# Patient Record
Sex: Male | Born: 1965 | Race: White | Hispanic: No | Smoking: Never smoker
Health system: Southern US, Community
[De-identification: ages and names within clinical notes are randomized; demographics above are authoritative.]

## PROBLEM LIST (undated history)

## (undated) ENCOUNTER — Emergency Department (HOSPITAL_COMMUNITY): Discharge status: Absent without leave | Payer: Self-pay

---

## 2004-06-30 ENCOUNTER — Emergency Department (HOSPITAL_COMMUNITY): Admission: EM | Admit: 2004-06-30 | Discharge: 2004-06-30 | Payer: Self-pay | Admitting: Emergency Medicine

## 2006-02-26 ENCOUNTER — Ambulatory Visit: Payer: Self-pay | Admitting: Internal Medicine

## 2011-08-09 ENCOUNTER — Encounter (HOSPITAL_BASED_OUTPATIENT_CLINIC_OR_DEPARTMENT_OTHER): Payer: Self-pay | Admitting: *Deleted

## 2011-08-09 ENCOUNTER — Emergency Department (HOSPITAL_BASED_OUTPATIENT_CLINIC_OR_DEPARTMENT_OTHER)
Admission: EM | Admit: 2011-08-09 | Discharge: 2011-08-09 | Disposition: A | Discharge status: Home / Self Care | Payer: Self-pay | Attending: Emergency Medicine | Admitting: Emergency Medicine

## 2011-08-09 DIAGNOSIS — L02416 Cutaneous abscess of left lower limb: Secondary | ICD-10-CM

## 2011-08-09 DIAGNOSIS — L02419 Cutaneous abscess of limb, unspecified: Secondary | ICD-10-CM | POA: Insufficient documentation

## 2011-08-09 MED ORDER — SODIUM CHLORIDE 0.9 % IV BOLUS (SEPSIS): 1000.0000 mL | Freq: Once | INTRAVENOUS | Status: DC

## 2011-08-09 MED ORDER — INSULIN REGULAR HUMAN 100 UNIT/ML IJ SOLN: 10.0000 [IU] | Freq: Once | INTRAMUSCULAR | Status: DC

## 2011-08-09 MED ORDER — CEPHALEXIN 500 MG PO CAPS: 500.0000 mg | ORAL_CAPSULE | Freq: Three times a day (TID) | ORAL | Status: AC

## 2011-08-09 MED ORDER — POTASSIUM CHLORIDE CRYS ER 20 MEQ PO TBCR: 40.0000 meq | EXTENDED_RELEASE_TABLET | Freq: Once | ORAL | Status: DC

## 2011-08-09 MED ORDER — SULFAMETHOXAZOLE-TRIMETHOPRIM 800-160 MG PO TABS: 1.0000 | ORAL_TABLET | Freq: Two times a day (BID) | ORAL | Status: AC

## 2011-08-09 MED ORDER — HYDROCODONE-ACETAMINOPHEN 5-500 MG PO TABS: 1.0000 | ORAL_TABLET | Freq: Four times a day (QID) | ORAL | Status: AC | PRN

## 2011-08-09 NOTE — ED Notes (Signed)
Pt c/o left thigh abscess x 5 days. Redness noted

## 2011-08-09 NOTE — ED Provider Notes (Signed)
History     CSN: [id number]  Arrival date & time 08/09/11  2150   First MD Initiated Contact with Patient 08/09/11 2257      Chief Complaint  Patient presents with  . Abscess    (Consider location/radiation/quality/duration/timing/severity/associated sxs/prior treatment) Patient is a 46 y.o. male presenting with abscess. The history is provided by the patient.  Abscess  This is a new problem. Episode onset: 5 days ago. The onset was gradual. The problem occurs continuously. The problem has been gradually worsening. Affected Location: left thigh. The problem is moderate. The abscess is characterized by painfulness and redness. Associated with: nothing. The abscess first occurred at home.    History reviewed. No pertinent past medical history.  History reviewed. No pertinent past surgical history.  History reviewed. No pertinent family history.  History  Substance Use Topics  . Smoking status: Never Smoker   . Smokeless tobacco: Not on file  . Alcohol Use: No      Review of Systems  All other systems reviewed and are negative.    Allergies  Review of patient's allergies indicates no known allergies.  Home Medications   Current Outpatient Rx  Name Route Sig Dispense Refill  . DOXYCYCLINE HYCLATE 100 MG PO TBEC Oral Take 100 mg by mouth 2 (two) times daily.    . TRIPLE ANTIBIOTIC [phone] EX OINT Topical Apply topically 4 (four) times daily. Patient used this medication for his abscess.      BP 100/74  Pulse 90  Temp 98.8 F (37.1 C) (Oral)  Resp 16  Ht 6' (1.829 m)  Wt 240 lb (108.863 kg)  BMI 32.55 kg/m2  SpO2 100%  Physical Exam  Nursing note and vitals reviewed. Constitutional: He is oriented to person, place, and time. He appears well-developed and well-nourished. No distress.  HENT:  Head: Normocephalic and atraumatic.  Neck: Normal range of motion. Neck supple.  Musculoskeletal:       There is an small, firm hair follicle with surrounding  erythema and warmth on the posterior aspect of the left thigh.  There is no definite fluctuance.  Neurological: He is alert and oriented to person, place, and time.  Skin: Skin is warm and dry. He is not diaphoretic.    ED Course  Procedures (including critical care time)  Labs Reviewed - No data to display No results found.   No diagnosis found.  INCISION AND DRAINAGE Performed by: Geoffery Lyons Consent: Verbal consent obtained. Risks and benefits: risks, benefits and alternatives were discussed Type: abscess  Body area: left thigh  Anesthesia: local infiltration  Local anesthetic: lidocaine 1% without epinephrine  Anesthetic total: 2 ml  Complexity: complex Blunt dissection to break up loculations  Drainage: purulent  Drainage amount: small  Packing material: no packing  Patient tolerance: Patient tolerated the procedure well with no immediate complications.     MDM  Warm soaks, keflex, and bactrim.  Return prn if worsening.        Geoffery Lyons, MD 08/09/11 [phone]

## 2013-10-28 ENCOUNTER — Emergency Department (HOSPITAL_BASED_OUTPATIENT_CLINIC_OR_DEPARTMENT_OTHER): Payer: No Typology Code available for payment source

## 2013-10-28 ENCOUNTER — Encounter (HOSPITAL_BASED_OUTPATIENT_CLINIC_OR_DEPARTMENT_OTHER): Payer: Self-pay | Admitting: Emergency Medicine

## 2013-10-28 ENCOUNTER — Emergency Department (HOSPITAL_BASED_OUTPATIENT_CLINIC_OR_DEPARTMENT_OTHER)
Admission: EM | Admit: 2013-10-28 | Discharge: 2013-10-28 | Disposition: A | Discharge status: Home / Self Care | Payer: No Typology Code available for payment source | Attending: Emergency Medicine | Admitting: Emergency Medicine

## 2013-10-28 DIAGNOSIS — Y9389 Activity, other specified: Secondary | ICD-10-CM | POA: Diagnosis not present

## 2013-10-28 DIAGNOSIS — S8000XA Contusion of unspecified knee, initial encounter: Secondary | ICD-10-CM | POA: Diagnosis not present

## 2013-10-28 DIAGNOSIS — S60229A Contusion of unspecified hand, initial encounter: Secondary | ICD-10-CM | POA: Insufficient documentation

## 2013-10-28 DIAGNOSIS — S6990XA Unspecified injury of unspecified wrist, hand and finger(s), initial encounter: Secondary | ICD-10-CM | POA: Diagnosis present

## 2013-10-28 DIAGNOSIS — Y92410 Unspecified street and highway as the place of occurrence of the external cause: Secondary | ICD-10-CM | POA: Diagnosis not present

## 2013-10-28 DIAGNOSIS — S60222A Contusion of left hand, initial encounter: Secondary | ICD-10-CM

## 2013-10-28 DIAGNOSIS — Z79899 Other long term (current) drug therapy: Secondary | ICD-10-CM | POA: Insufficient documentation

## 2013-10-28 DIAGNOSIS — S8002XA Contusion of left knee, initial encounter: Secondary | ICD-10-CM

## 2013-10-28 DIAGNOSIS — Z792 Long term (current) use of antibiotics: Secondary | ICD-10-CM | POA: Diagnosis not present

## 2013-10-28 MED ORDER — NAPROXEN 375 MG PO TABS
375.0000 mg | ORAL_TABLET | Freq: Two times a day (BID) | ORAL | Status: DC
Start: 2013-10-28 — End: 2019-08-02

## 2013-10-28 NOTE — Discharge Instructions (Signed)
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
## 2013-10-28 NOTE — ED Notes (Signed)
MD at bedside. 

## 2013-11-10 ENCOUNTER — Encounter: Payer: Self-pay | Admitting: Family Medicine

## 2013-11-10 ENCOUNTER — Ambulatory Visit (INDEPENDENT_AMBULATORY_CARE_PROVIDER_SITE_OTHER): Payer: Self-pay | Admitting: Family Medicine

## 2013-11-10 VITALS — BP 135/82 | HR 49 | Ht 75.0 in | Wt 240.0 lb

## 2013-11-10 DIAGNOSIS — S6980XA Other specified injuries of unspecified wrist, hand and finger(s), initial encounter: Secondary | ICD-10-CM

## 2013-11-10 DIAGNOSIS — S6992XA Unspecified injury of left wrist, hand and finger(s), initial encounter: Secondary | ICD-10-CM

## 2013-11-10 DIAGNOSIS — S6990XA Unspecified injury of unspecified wrist, hand and finger(s), initial encounter: Secondary | ICD-10-CM

## 2013-11-10 NOTE — Patient Instructions (Signed)
Start occupational therapy - do home exercises on days you don't go to therapy. Icing 15 minutes at a time 3-4 times a day. Ibuprofen  three times a day with food OR aleve 2 tabs twice a day with food for pain and inflammation. Follow up with me in 4 weeks for reevaluation. Will consider MRI if not improving as expected.

## 2013-11-11 ENCOUNTER — Encounter: Payer: Self-pay | Admitting: Family Medicine

## 2013-11-11 DIAGNOSIS — S6992XA Unspecified injury of left wrist, hand and finger(s), initial encounter: Secondary | ICD-10-CM | POA: Insufficient documentation
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
## 2013-12-09 ENCOUNTER — Encounter (INDEPENDENT_AMBULATORY_CARE_PROVIDER_SITE_OTHER): Payer: Self-pay

## 2013-12-09 ENCOUNTER — Encounter: Payer: Self-pay | Admitting: Family Medicine

## 2013-12-09 ENCOUNTER — Ambulatory Visit (INDEPENDENT_AMBULATORY_CARE_PROVIDER_SITE_OTHER): Payer: Self-pay | Admitting: Family Medicine

## 2013-12-09 VITALS — BP 117/77 | HR 65 | Ht 75.0 in | Wt 240.0 lb

## 2013-12-09 DIAGNOSIS — S6992XD Unspecified injury of left wrist, hand and finger(s), subsequent encounter: Secondary | ICD-10-CM

## 2013-12-11 ENCOUNTER — Encounter: Payer: Self-pay | Admitting: Family Medicine
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
## 2013-12-11 NOTE — Assessment & Plan Note (Signed)
Improving though still has fatigue with extension of digits.  Radiographs and ultrasound negative for fracture or tendon rupture.  Continue with occupational therapy and home exercises.  Icing, nsaids only if needed.  F/u in 6 weeks.  If not improving would consider MRI.

## 2013-12-25 ENCOUNTER — Encounter: Payer: Self-pay | Admitting: Family Medicine

## 2013-12-25 ENCOUNTER — Ambulatory Visit (INDEPENDENT_AMBULATORY_CARE_PROVIDER_SITE_OTHER): Payer: Self-pay | Admitting: Family Medicine

## 2013-12-25 VITALS — BP 122/78 | HR 65 | Ht 75.0 in | Wt 240.0 lb

## 2013-12-25 DIAGNOSIS — S6992XD Unspecified injury of left wrist, hand and finger(s), subsequent encounter: Secondary | ICD-10-CM

## 2013-12-26 ENCOUNTER — Ambulatory Visit
Admission: RE | Admit: 2013-12-26 | Discharge: 2013-12-26 | Disposition: A | Discharge status: Home / Self Care | Payer: Self-pay | Source: Ambulatory Visit | Attending: Family Medicine | Admitting: Family Medicine

## 2013-12-26 ENCOUNTER — Other Ambulatory Visit: Payer: Self-pay | Admitting: Family Medicine

## 2013-12-26 DIAGNOSIS — Z139 Encounter for screening, unspecified: Secondary | ICD-10-CM

## 2013-12-26 DIAGNOSIS — S6992XD Unspecified injury of left wrist, hand and finger(s), subsequent encounter: Secondary | ICD-10-CM

## 2013-12-28 ENCOUNTER — Other Ambulatory Visit: Payer: Self-pay

## 2013-12-31 NOTE — Assessment & Plan Note (Signed)
Patient has had a setback in clinical healing.  Feels more weakness with repetitive activities, extension of digits.  Felt worse with therapy.  Will go ahead with MRI to assess for partial extensor tendon tear - if high grade will refer to hand surgery.
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
## 2014-01-20 ENCOUNTER — Ambulatory Visit: Payer: Self-pay | Admitting: Family Medicine

## 2014-02-10 ENCOUNTER — Ambulatory Visit: Payer: Self-pay | Admitting: Family Medicine

## 2014-04-10 ENCOUNTER — Encounter: Payer: Self-pay | Admitting: Family Medicine

## 2014-04-10 ENCOUNTER — Ambulatory Visit (INDEPENDENT_AMBULATORY_CARE_PROVIDER_SITE_OTHER): Payer: Self-pay | Admitting: Family Medicine

## 2014-04-10 ENCOUNTER — Encounter (INDEPENDENT_AMBULATORY_CARE_PROVIDER_SITE_OTHER): Payer: Self-pay

## 2014-04-10 VITALS — BP 137/76 | HR 60 | Ht 75.0 in | Wt 250.0 lb

## 2014-04-10 DIAGNOSIS — S6992XD Unspecified injury of left wrist, hand and finger(s), subsequent encounter: Secondary | ICD-10-CM
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
## 2016-04-11 IMAGING — CR DG ORBITS FOR FOREIGN BODY
2 series · 2 of 2 positions shown · non-contrast
Comparison: None.

CLINICAL DATA: Metal working/exposure; clearance prior to MRI

EXAM:
ORBITS FOR FOREIGN BODY - 2 VIEW

[view not recorded (1 of 2)]
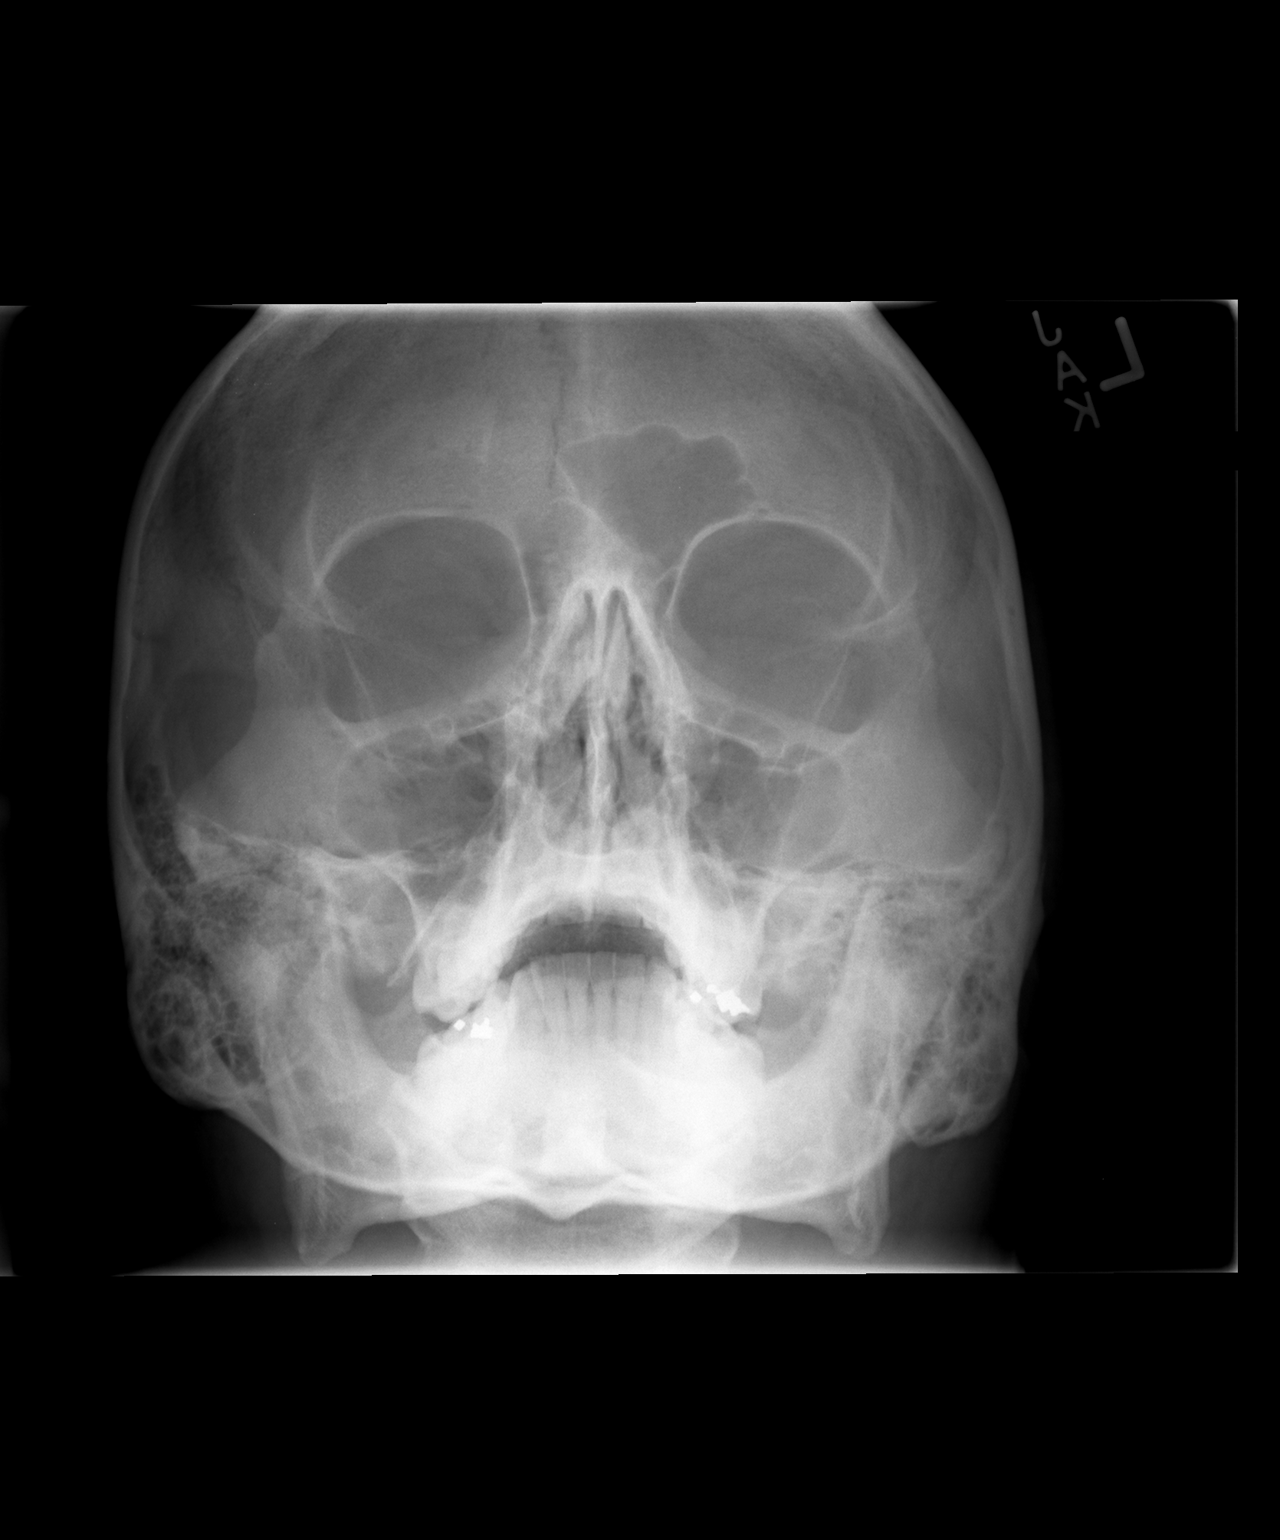

[view not recorded (2 of 2)]
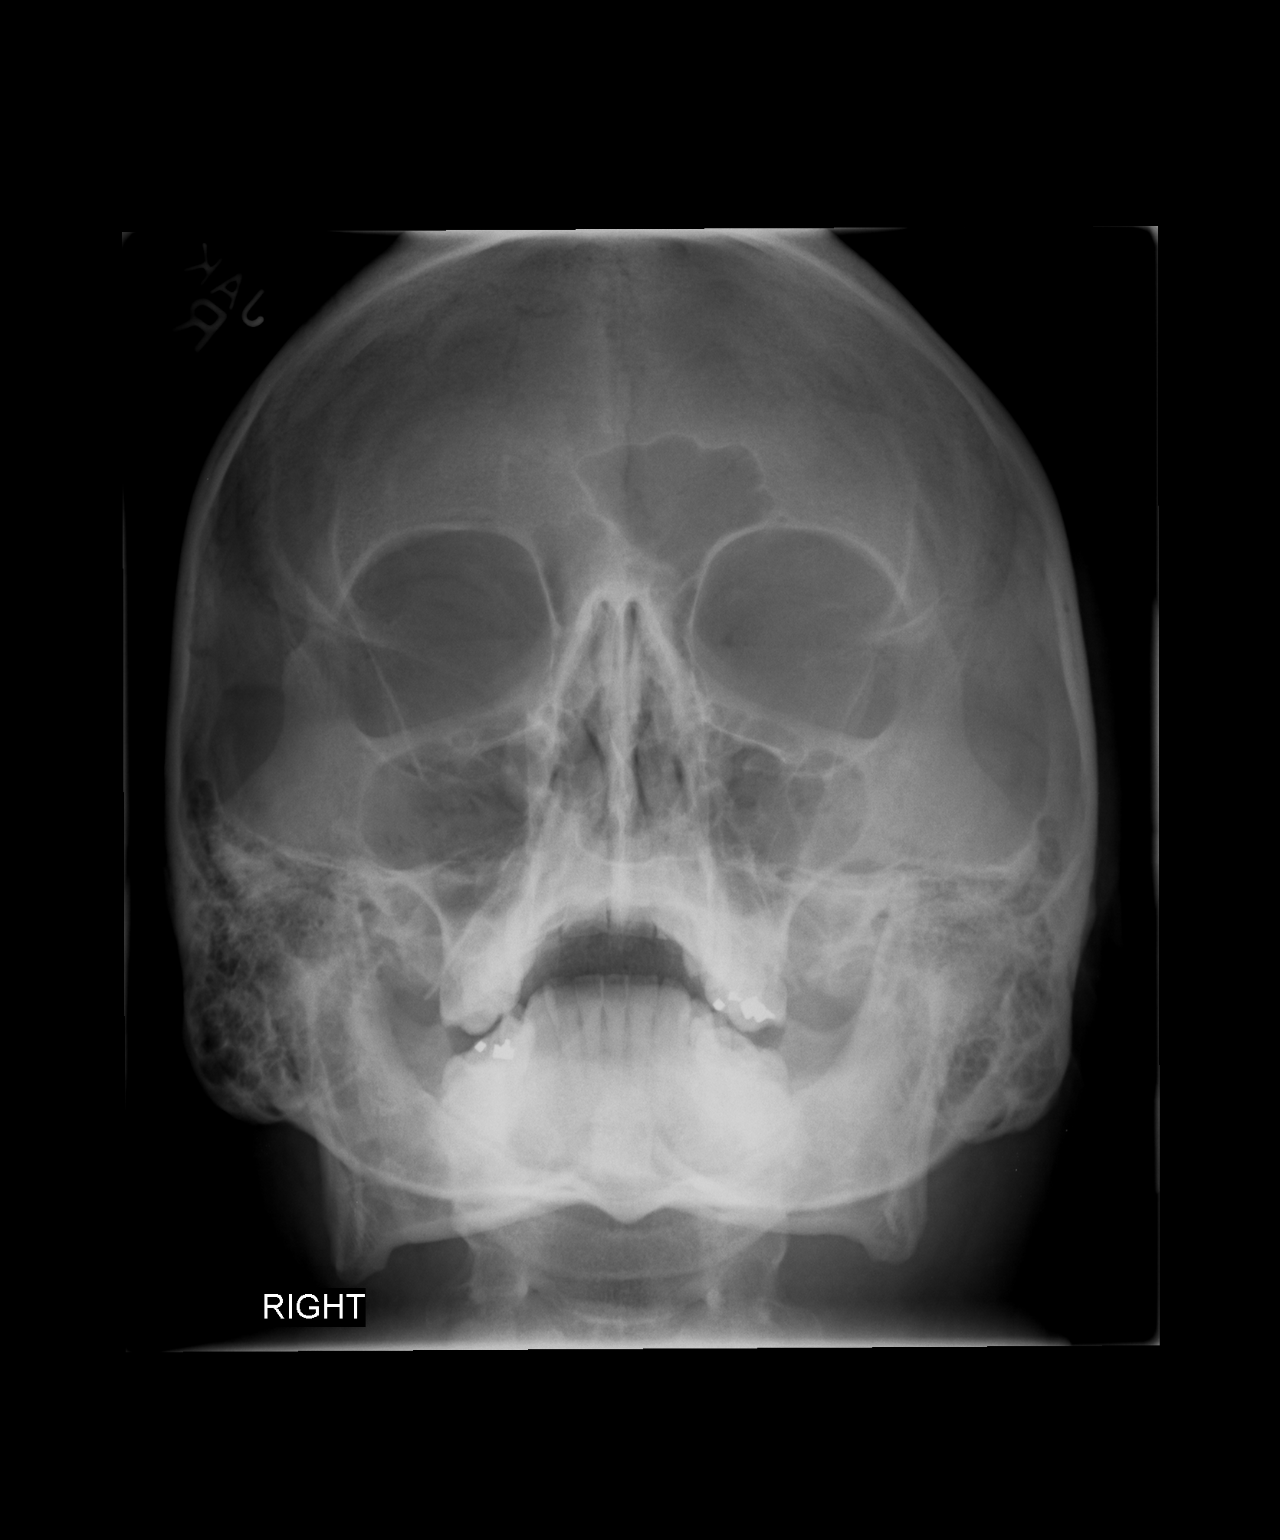

[2 of 2 positions shown; findings below may reference images not displayed]

FINDINGS: There is no evidence of metallic foreign body within the orbits. No
significant bone abnormality identified.
IMPRESSION: No evidence of metallic foreign body within the orbits.

## 2019-08-02 ENCOUNTER — Other Ambulatory Visit: Payer: Self-pay

## 2019-08-02 ENCOUNTER — Emergency Department (INDEPENDENT_AMBULATORY_CARE_PROVIDER_SITE_OTHER)
Admission: EM | Admit: 2019-08-02 | Discharge: 2019-08-02 | Disposition: A | Discharge status: Home / Self Care | Payer: No Typology Code available for payment source | Source: Home / Self Care | Attending: Family Medicine | Admitting: Family Medicine

## 2019-08-02 DIAGNOSIS — W57XXXA Bitten or stung by nonvenomous insect and other nonvenomous arthropods, initial encounter: Secondary | ICD-10-CM | POA: Diagnosis not present

## 2019-08-02 DIAGNOSIS — S70362A Insect bite (nonvenomous), left thigh, initial encounter: Secondary | ICD-10-CM

## 2019-08-02 MED ORDER — DOXYCYCLINE HYCLATE 100 MG PO TBEC: 100.0000 mg | DELAYED_RELEASE_TABLET | Freq: Two times a day (BID) | ORAL | 0 refills | Status: AC

## 2019-08-02 NOTE — ED Triage Notes (Signed)
Pt c/o tick bite x 2 days. Not sure if parts are still embedded. Some drainage and itching.

## 2019-08-02 NOTE — ED Provider Notes (Signed)
[facility]    CSN: [id number] Arrival date & time: 08/02/19  1026      History   Chief Complaint Chief Complaint  Patient presents with  . Insect Bite    tick    HPI Henry Lyons is a 54 y.o. male.   2 to 3 days ago patient found an embedded lone star tick on his left anterior thigh.  The tick was not engorged and he removed it without difficulty.  He is completely assymptomatic at present. Current records show a Tdap administered 02/2005.  He believes he has had a more recent Tdap at the [place].  The history is provided by the patient.    History reviewed. No pertinent past medical history.  Patient Active Problem List   Diagnosis Date Noted  . Injury of left hand 11/11/2013    History reviewed. No pertinent surgical history.     Home Medications    Prior to Admission medications   Medication Sig Start Date End Date Taking? Authorizing Provider  doxycycline (DORYX) 100 MG EC tablet Take 1 tablet (100 mg total) by mouth 2 (two) times daily. Take with food.  Wear sun protection while taking doxycycline 08/02/19   Lattie Haw, MD    Family History History reviewed. No pertinent family history.  Social History Social History   Tobacco Use  . Smoking status: Never Smoker  . Smokeless tobacco: Never Used  Substance Use Topics  . Alcohol use: No    Alcohol/week: 0.0 standard drinks  . Drug use: No     Allergies   Patient has no known allergies.   Review of Systems Review of Systems  Constitutional: Negative.   HENT: Negative.   Respiratory: Negative.   Cardiovascular: Negative.   Gastrointestinal: Negative.   Genitourinary: Negative.   Musculoskeletal: Negative.   Skin: Negative.   Neurological: Negative.      Physical Exam Triage Vital Signs ED Triage Vitals  Enc Vitals Group     BP 08/02/19 1101 (!) 146/95     Pulse Rate 08/02/19 1101 (!) 54     Resp 08/02/19 1101 18     Temp 08/02/19 1101 98.4 F (36.9 C)     Temp  Source 08/02/19 1101 Oral     SpO2 08/02/19 1101 98 %     Weight --      Height --      Head Circumference --      Peak Flow --      Pain Score 08/02/19 1102 0     Pain Loc --      Pain Edu? --      Excl. in GC? --    No data found.  Updated Vital Signs BP (!) 146/95 (BP Location: Left Arm)   Pulse (!) 54   Temp 98.4 F (36.9 C) (Oral)   Resp 18   SpO2 98%   Visual Acuity Right Eye Distance:   Left Eye Distance:   Bilateral Distance:    Right Eye Near:   Left Eye Near:    Bilateral Near:     Physical Exam Vitals and nursing note reviewed.  Constitutional:      General: He is not in acute distress. HENT:     Head: Normocephalic.     Nose: Nose normal.  Eyes:     Pupils: Pupils are equal, round, and reactive to light.  Cardiovascular:     Rate and Rhythm: Normal rate.  Pulmonary:     Effort:  Pulmonary effort is normal.  Skin:    General: Skin is warm and dry.          Comments: Left anterior thigh has a 43mm diameter eschar without induration or tenderness.  Minimal surrounding erythema.  Neurological:     Mental Status: He is alert.      UC Treatments / Results  Labs (all labs ordered are listed, but only abnormal results are displayed) Labs Reviewed - No data to display  EKG   Radiology No results found.  Procedures Procedures (including critical care time)  Medications Ordered in UC Medications - No data to display  Initial Impression / Assessment and Plan / UC Course  I have reviewed the triage vital signs and the nursing notes.  Pertinent labs & imaging results that were available during my care of the patient were reviewed by me and considered in my medical decision making (see chart for details).    Begin doxycycline. Followup with [facility] if not improved within 5 days.  Advised to find out his last Tdap   Final Clinical Impressions(s) / UC Diagnoses   Final diagnoses:  Infected tick bite, initial encounter   Discharge  Instructions   None    ED Prescriptions    Medication Sig Dispense Auth. Provider   doxycycline (DORYX) 100 MG EC tablet Take 1 tablet (100 mg total) by mouth 2 (two) times daily. Take with food.  Wear sun protection while taking doxycycline 20 tablet Kandra Nicolas, MD        Kandra Nicolas, MD 08/04/19 1800
